# Patient Record
Sex: Male | Born: 1960 | Race: White | Hispanic: No | Marital: Single | Smoking: Former smoker
Health system: Southern US, Community
[De-identification: ages and names within clinical notes are randomized; demographics above are authoritative.]

## PROBLEM LIST (undated history)

## (undated) DIAGNOSIS — I10 Essential (primary) hypertension: Secondary | ICD-10-CM

## (undated) DIAGNOSIS — E785 Hyperlipidemia, unspecified: Secondary | ICD-10-CM

## (undated) DIAGNOSIS — I499 Cardiac arrhythmia, unspecified: Secondary | ICD-10-CM

## (undated) HISTORY — DX: Essential (primary) hypertension: I10

## (undated) HISTORY — DX: Hyperlipidemia, unspecified: E78.5

## (undated) HISTORY — DX: Cardiac arrhythmia, unspecified: I49.9

## (undated) HISTORY — PX: WISDOM TOOTH EXTRACTION: SHX21

---
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
## 2016-02-22 ENCOUNTER — Other Ambulatory Visit: Payer: Self-pay | Admitting: Internal Medicine

## 2016-02-22 DIAGNOSIS — E7800 Pure hypercholesterolemia, unspecified: Secondary | ICD-10-CM

## 2016-02-22 DIAGNOSIS — I159 Secondary hypertension, unspecified: Secondary | ICD-10-CM

## 2016-02-22 DIAGNOSIS — IMO0001 Reserved for inherently not codable concepts without codable children: Secondary | ICD-10-CM

## 2016-02-25 ENCOUNTER — Encounter: Payer: Self-pay | Admitting: Internal Medicine

## 2016-03-05 ENCOUNTER — Ambulatory Visit
Admission: RE | Admit: 2016-03-05 | Discharge: 2016-03-05 | Disposition: A | Discharge status: Home / Self Care | Payer: 59 | Source: Ambulatory Visit | Attending: Internal Medicine | Admitting: Internal Medicine

## 2016-03-05 DIAGNOSIS — E7800 Pure hypercholesterolemia, unspecified: Secondary | ICD-10-CM

## 2016-03-05 DIAGNOSIS — I159 Secondary hypertension, unspecified: Secondary | ICD-10-CM

## 2016-03-05 DIAGNOSIS — IMO0001 Reserved for inherently not codable concepts without codable children: Secondary | ICD-10-CM

## 2016-04-25 ENCOUNTER — Ambulatory Visit (AMBULATORY_SURGERY_CENTER): Payer: Self-pay

## 2016-04-25 VITALS — Ht 71.0 in | Wt 232.6 lb

## 2016-04-25 DIAGNOSIS — Z1211 Encounter for screening for malignant neoplasm of colon: Secondary | ICD-10-CM

## 2016-04-25 NOTE — Progress Notes (Signed)
No allergies to eggs or soy No past problems with anesthesia No diet meds No home oxygen  Declined emmi 

## 2016-04-28 ENCOUNTER — Encounter: Payer: Self-pay | Admitting: Internal Medicine

## 2016-05-09 ENCOUNTER — Encounter: Payer: Self-pay | Admitting: Gastroenterology

## 2016-05-09 ENCOUNTER — Encounter: Payer: Self-pay | Admitting: Internal Medicine

## 2016-05-09 ENCOUNTER — Ambulatory Visit (AMBULATORY_SURGERY_CENTER): Payer: 59 | Admitting: Internal Medicine

## 2016-05-09 VITALS — BP 115/76 | HR 66 | Temp 98.9°F | Resp 18 | Ht 71.0 in | Wt 232.0 lb

## 2016-05-09 DIAGNOSIS — Z1211 Encounter for screening for malignant neoplasm of colon: Secondary | ICD-10-CM | POA: Diagnosis not present

## 2016-05-09 MED ORDER — FLEET ENEMA 7-19 GM/118ML RE ENEM
1.0000 | ENEMA | Freq: Once | RECTAL | Status: AC
  Administered 2016-05-09: 1 via RECTAL

## 2016-05-09 MED ORDER — SODIUM CHLORIDE 0.9 % IV SOLN: 500.0000 mL | INTRAVENOUS | Status: AC

## 2016-05-09 NOTE — Patient Instructions (Addendum)
   Normal colonoscopy  Next routine colonoscopy in 10 years - 2027  I appreciate the opportunity to care for you. Iva Booparl E. Gessner, MD, FACG  YOU HAD AN ENDOSCOPIC PROCEDURE TODAY AT THE [facility]:   Refer to the procedure report that was given to you for any specific questions about what was found during the examination.  If the procedure report does not answer your questions, please call your gastroenterologist to clarify.  If you requested that your care partner not be given the details of your procedure findings, then the procedure report has been included in a sealed envelope for you to review at your convenience later.  YOU SHOULD EXPECT: Some feelings of bloating in the abdomen. Passage of more gas than usual.  Walking can help get rid of the air that was put into your GI tract during the procedure and reduce the bloating. If you had a lower endoscopy (such as a colonoscopy or flexible sigmoidoscopy) you may notice spotting of blood in your stool or on the toilet paper. If you underwent a bowel prep for your procedure, you may not have a normal bowel movement for a few days.  Please Note:  You might notice some irritation and congestion in your nose or some drainage.  This is from the oxygen used during your procedure.  There is no need for concern and it should clear up in a day or so.  SYMPTOMS TO REPORT IMMEDIATELY:   Following lower endoscopy (colonoscopy or flexible sigmoidoscopy):  Excessive amounts of blood in the stool  Significant tenderness or worsening of abdominal pains  Swelling of the abdomen that is new, acute  Fever of 100F or higher   For urgent or emergent issues, a gastroenterologist can be reached at any hour by calling [phone].   DIET:  We do recommend a small meal at first, but then you may proceed to your regular diet.  Drink plenty of fluids but you should avoid alcoholic beverages for 24 hours.  ACTIVITY:  You should plan to take  it easy for the rest of today and you should NOT DRIVE or use heavy machinery until tomorrow (because of the sedation medicines used during the test).    FOLLOW UP: Our staff will call the number listed on your records the next business day following your procedure to check on you and address any questions or concerns that you may have regarding the information given to you following your procedure. If we do not reach you, we will leave a message.  However, if you are feeling well and you are not experiencing any problems, there is no need to return our call.  We will assume that you have returned to your regular daily activities without incident.  If any biopsies were taken you will be contacted by phone or by letter within the next 1-3 weeks.  Please call us at [phone] if you have not heard about the biopsies in 3 weeks.    SIGNATURES/CONFIDENTIALITY: You and/or your care partner have signed paperwork which will be entered into your electronic medical record.  These signatures attest to the fact that that the information above on your After Visit Summary has been reviewed and is understood.  Full responsibility of the confidentiality of this discharge information lies with you and/or your care-partner.  Read all of the information given to you by your recovery room nurse.   Thank-you for choosing us for your healthcare needs today.

## 2016-05-09 NOTE — Op Note (Signed)
[facility] Patient Name: Steve KinJeffrey Holloway Procedure Date: 05/09/2016 11:41 AM MRN: [id number] Endoscopist: Iva Booparl E Gessner , MD Age: [id number] Referring MD:  Date of Birth: [birth year] Gender: Male Account #: [id number] Procedure:                Colonoscopy Indications:              Screening for colorectal malignant neoplasm Medicines:                Propofol per Anesthesia, Monitored Anesthesia Care Procedure:                Pre-Anesthesia Assessment:                           - Prior to the procedure, a History and Physical                            was performed, and patient medications and                            allergies were reviewed. The patient's tolerance of                            previous anesthesia was also reviewed. The risks                            and benefits of the procedure and the sedation                            options and risks were discussed with the patient.                            All questions were answered, and informed consent                            was obtained. Prior Anticoagulants: The patient has                            taken no previous anticoagulant or antiplatelet                            agents. ASA Grade Assessment: II - A patient with                            mild systemic disease. After reviewing the risks                            and benefits, the patient was deemed in                            satisfactory condition to undergo the procedure.                           After obtaining informed consent, the colonoscope  was passed under direct vision. Throughout the                            procedure, the patient's blood pressure, pulse, and                            oxygen saturations were monitored continuously. The                            Model CF-HQ190L [id number]) scope was introduced                            through the anus and advanced to the the cecum,           identified by appendiceal orifice and ileocecal                            valve. The quality of the bowel preparation was                            excellent. The colonoscopy was performed without                            difficulty. The patient tolerated the procedure                            well. The bowel preparation used was Miralax. The                            ileocecal valve, appendiceal orifice, and rectum                            were photographed. Scope In: 11:44:54 AM Scope Out: 12:01:24 PM Scope Withdrawal Time: 0 hours 10 minutes 39 seconds  Total Procedure Duration: 0 hours 16 minutes 30 seconds  Findings:                 The perianal and digital rectal examinations were                            normal. Pertinent negatives include normal prostate                            (size, shape, and consistency).                           The colon (entire examined portion) appeared normal.                           No additional abnormalities were found on                            retroflexion. Complications:            No immediate complications. Estimated blood loss:  None. Estimated Blood Loss:     Estimated blood loss: none. Impression:               - The entire examined colon is normal.                           - No specimens collected. Recommendation:           - Repeat colonoscopy in 10 years for screening                            purposes.                           - Patient has a contact number available for                            emergencies. The signs and symptoms of potential                            delayed complications were discussed with the                            patient. Return to normal activities tomorrow.                            Written discharge instructions were provided to the                            patient.                           - Resume previous diet.                           -  Continue present medications.                           - Patient has a contact number available for                            emergencies. The signs and symptoms of potential                            delayed complications were discussed with the                            patient. Return to normal activities tomorrow.                            Written discharge instructions were provided to the                            patient. Iva Boop, MD 05/09/2016 12:11:11 PM This report has been signed electronically.

## 2016-05-09 NOTE — Progress Notes (Signed)
No egg or soy allergy known to patient  No issues with past sedation with any surgeries  or procedures, no intubation problems  No diet pills per patient No home 02 use per patient  No blood thinners per patient  Pt states issues with constipation periodically - No A fib or A flutter   Pt states did not look at stool- first said it was mushy brown, then said it was liquid but he didn't look at results-- informed Dr Leone PayorGessner- he instructed enema x 1- pt did enema and results were yellow, slight cloudiness and some sediment but mostly clear.  MD informed- Bufford Spikesmarie Tyshon Fanning rn

## 2016-05-09 NOTE — Progress Notes (Signed)
A/ox3 pleased with MAC, report to Suzanne RN 

## 2016-05-12 ENCOUNTER — Telehealth: Payer: Self-pay

## 2016-05-12 NOTE — Telephone Encounter (Signed)
  Follow up Call-  Call back number 05/09/2016  Post procedure Call Back phone  # [phone]  Permission to leave phone message Yes  Some recent data might be hidden    Patient was called for follow up after his procedure on 05/09/2016. No answer at the number given for follow up phone call. This was the second attempt to contact the patient. A message was left on the answering machine.

## 2016-05-12 NOTE — Telephone Encounter (Signed)
Called [phone] with the pt ID on the message, asked him to call us back if he has any questions or concerns. maw

## 2016-12-09 ENCOUNTER — Ambulatory Visit: Payer: Self-pay

## 2016-12-09 ENCOUNTER — Ambulatory Visit (INDEPENDENT_AMBULATORY_CARE_PROVIDER_SITE_OTHER): Payer: 59 | Admitting: Sports Medicine

## 2016-12-09 ENCOUNTER — Encounter: Payer: Self-pay | Admitting: Sports Medicine

## 2016-12-09 VITALS — BP 118/71 | Ht 71.0 in | Wt 230.0 lb

## 2016-12-09 DIAGNOSIS — G8929 Other chronic pain: Secondary | ICD-10-CM

## 2016-12-09 DIAGNOSIS — M25562 Pain in left knee: Secondary | ICD-10-CM | POA: Diagnosis not present

## 2016-12-09 NOTE — Progress Notes (Signed)
  Steve Holloway - 56 y.o. male MRN [id number]  Date of birth: 1961-06-13  SUBJECTIVE:  Including CC & ROS.   Mr. Steve Holloway is a 40 her old male is presenting with left knee pain. He reports about a year ago that he was performing leg extensions and then running on a treadmill and felt a sharp pain upon his knee. There is no swelling or bruising when this initially occurred. Since that time he has had intermittent medial knee pain with activity. He feels like the pain has stayed the same. He does have some soreness and stiffness after being active. He is invited take any medications for this pain.  ROS: No unexpected weight loss, fever, chills, swelling, instability, muscle pain, numbness/tingling, redness, otherwise see HPI   HISTORY: Past Medical, Surgical, Social, and Family History Reviewed & Updated per EMR.   Pertinent Historical Findings include: PMHx - hyperlipidemia, hypertension PSx-  I&D of scrotal cyst PSHx -  occasional tobacco and alcohol use FHx -  diabetes and hypertension  DATA REVIEWED: none  PHYSICAL EXAM:  VS: BP 118/71   Ht  (1.803 m)   Wt 230 lb (104.3 kg)   BMI 32.08 kg/m  PHYSICAL EXAM: Gen: NAD, alert, cooperative with exam, well-appearing HEENT: clear conjunctiva, EOMI CV:  no edema, capillary refill brisk,  Resp: non-labored, normal speech Skin: no rashes, normal turgor  Neuro: no gross deficits.  Psych:  alert and oriented MSK: Left knee: No obvious effusion. No significant bony tenderness to the lateral or medial femoral condyle. Tenderness to palpation of the medial joint line. No tenderness to palpation over the lateral joint line. No tenderness to palpation the clot or patellar tendon. Normal flexion and extension. Normal strength. Normal and points valgus and varus stress testing. No pain with patellar compression and grind. Positive Thessaly test. Neurovascularly intact.  Limited ultrasound: Left Knee:  There is a mild effusion in  the suprapatellar pouch. The quadriceps tendon and patellar tendon were normal appearance. The lateral joint line displayed some arthritic changes with spurring. The medial joint line showed some spurring and flattening of the tibial plateau as well as some outpouching of the medial meniscus and some effusion in this area. The trochlear groove and some spurring of the medial and lateral femoral condyle  Summary: Findings are consistent with a arthritic changes in the medial joint line as well as outpouching of the medial meniscus to suggest degenerative meniscal change.    Ultrasound and interpretation by Clare Gandy, MD    ASSESSMENT & PLAN:   Chronic pain of left knee Likely his pain is associated with an underlying arthritic change but also has some degenerative changes in his meniscus as well. He would like to be as conservative as he can with treating this as his pain is not severe - Provided home exercises - Encouraged she can wear compression or take over-the-counter anti-inflammatories as needed - He'll follow-up in 6 weeks and if not improved can consider injection therapy and x-rays of his knee at that time.

## 2016-12-09 NOTE — Assessment & Plan Note (Signed)
Likely his pain is associated with an underlying arthritic change but also has some degenerative changes in his meniscus as well. He would like to be as conservative as he can with treating this as his pain is not severe - Provided home exercises - Encouraged she can wear compression or take over-the-counter anti-inflammatories as needed - He'll follow-up in 6 weeks and if not improved can consider injection therapy and x-rays of his knee at that time.

## 2016-12-30 ENCOUNTER — Other Ambulatory Visit: Payer: Self-pay | Admitting: *Deleted

## 2016-12-30 MED ORDER — MELOXICAM 15 MG PO TABS: ORAL_TABLET | ORAL | 1 refills | Status: AC

## 2017-01-19 ENCOUNTER — Encounter: Payer: Self-pay | Admitting: Sports Medicine

## 2017-01-19 ENCOUNTER — Ambulatory Visit (INDEPENDENT_AMBULATORY_CARE_PROVIDER_SITE_OTHER): Payer: 59 | Admitting: Sports Medicine

## 2017-01-19 VITALS — BP 142/80 | Ht 71.0 in | Wt 230.0 lb

## 2017-01-19 DIAGNOSIS — M25562 Pain in left knee: Secondary | ICD-10-CM

## 2017-01-19 NOTE — Progress Notes (Signed)
   Subjective:    Patient ID: Steve Holloway, male    DOB: May 20, 1961, 56 y.o.   MRN: [id number]  HPI Steve Holloway is a 56yo male presenting for knee pain follow up. Reports significant improvement in knee pain since last office visit (12/09/16) and denies pain today. Stopped taking Mobic 1.5wk ago since he believed it was causing constipation. Has noted no worsening of pain since discontinuing. Has not been doing the exercises given, but has instead been doing leg exercises at the gym 3-4 times per week, including knee extensions and flexion with weight. Denies knee locking or giving out.   Review of Systems Per HPI    Objective:   Physical Exam  Constitutional: He appears well-developed and well-nourished. No distress.  Cardiovascular: Normal rate.   Pulmonary/Chest: Effort normal. No respiratory distress.  Musculoskeletal:  ROM of knees symmetric bilaterally. No effusion noted in bilateral knees. Mild joint line tenderness of medial knee, improved since last exam. Mildly positive Thessaly's, improved from last visit. ACL, PCL, LCL, and MCL intact and symmetric bilaterally. No patellar apprehension noted.      Assessment & Plan:  1. Acute pain of left knee Improved. 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 Recommend knee exercises on days he does not go to the gym. May use Naproxen or Ibuprofen as needed for acute flares--recommend against scheduled dosing. Follow up as needed. Consider xrays and/or injection if worsening occurs.

## 2017-08-17 DIAGNOSIS — Z23 Encounter for immunization: Secondary | ICD-10-CM | POA: Diagnosis not present

## 2018-02-17 DIAGNOSIS — E7800 Pure hypercholesterolemia, unspecified: Secondary | ICD-10-CM | POA: Diagnosis not present

## 2018-02-17 DIAGNOSIS — Z125 Encounter for screening for malignant neoplasm of prostate: Secondary | ICD-10-CM | POA: Diagnosis not present

## 2018-02-17 DIAGNOSIS — Z0000 Encounter for general adult medical examination without abnormal findings: Secondary | ICD-10-CM | POA: Diagnosis not present

## 2018-02-24 DIAGNOSIS — Z0000 Encounter for general adult medical examination without abnormal findings: Secondary | ICD-10-CM | POA: Diagnosis not present

## 2018-02-24 DIAGNOSIS — M542 Cervicalgia: Secondary | ICD-10-CM | POA: Diagnosis not present

## 2018-02-24 DIAGNOSIS — D649 Anemia, unspecified: Secondary | ICD-10-CM | POA: Diagnosis not present

## 2018-02-24 DIAGNOSIS — Z833 Family history of diabetes mellitus: Secondary | ICD-10-CM | POA: Diagnosis not present

## 2018-06-16 DIAGNOSIS — Z23 Encounter for immunization: Secondary | ICD-10-CM | POA: Diagnosis not present

## 2021-01-29 ENCOUNTER — Other Ambulatory Visit: Payer: Self-pay | Admitting: Internal Medicine

## 2021-01-29 DIAGNOSIS — E7800 Pure hypercholesterolemia, unspecified: Secondary | ICD-10-CM

## 2021-01-29 DIAGNOSIS — I77811 Abdominal aortic ectasia: Secondary | ICD-10-CM

## 2021-02-25 ENCOUNTER — Ambulatory Visit
Admission: RE | Admit: 2021-02-25 | Discharge: 2021-02-25 | Disposition: A | Discharge status: Home / Self Care | Payer: 59 | Source: Ambulatory Visit | Attending: Internal Medicine | Admitting: Internal Medicine

## 2021-02-25 ENCOUNTER — Ambulatory Visit
Admission: RE | Admit: 2021-02-25 | Discharge: 2021-02-25 | Disposition: A | Discharge status: Home / Self Care | Payer: No Typology Code available for payment source | Source: Ambulatory Visit | Attending: Internal Medicine | Admitting: Internal Medicine

## 2021-02-25 DIAGNOSIS — I77811 Abdominal aortic ectasia: Secondary | ICD-10-CM

## 2021-02-25 DIAGNOSIS — E7800 Pure hypercholesterolemia, unspecified: Secondary | ICD-10-CM

## 2021-12-05 IMAGING — CT CT CARDIAC CORONARY ARTERY CALCIUM SCORE
3 series · 12 of 20 positions shown, 14 images · non-contrast
Comparison: None.

CLINICAL DATA: 59-year-old Caucasian male with history
hyperlipidemia, hypertension and family history of heart disease.

EXAM:
CT CARDIAC CORONARY ARTERY CALCIUM SCORE
TECHNIQUE: Non-contrast imaging through the heart was performed using
prospective ECG gating. Image post processing was performed on an
independent workstation, allowing for quantitative analysis of the
heart and coronary arteries. Note that this exam targets the heart
and the chest was not imaged in its entirety.

[Series 2: calcium scoring 2.00 qr36 bestdiast 70% hrt calciu · axial · 0.45mm/px · z∈[+1646,+1674]mm · 2 of 70 slices shown]
[im 14/70  vessel]
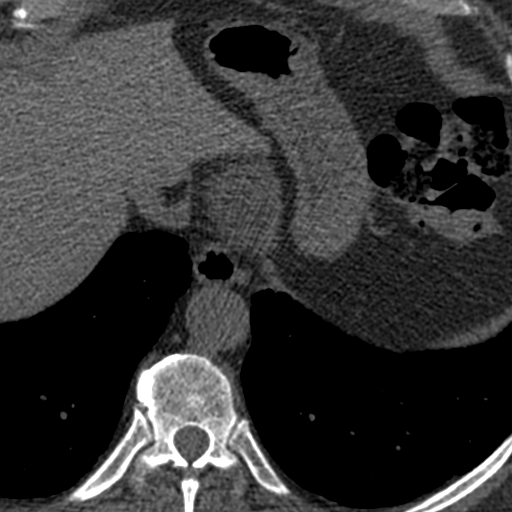
[im 28/70  vessel]
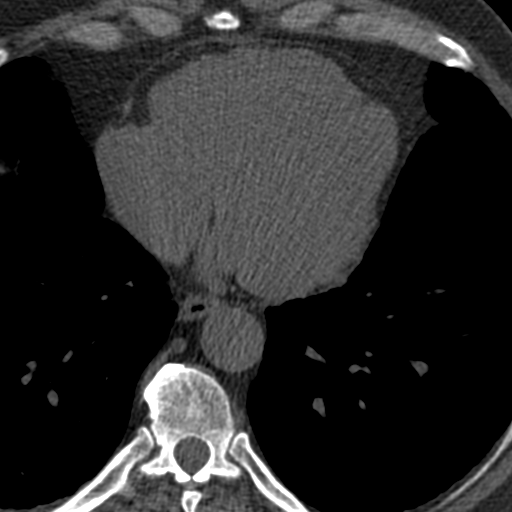

[Series 3: calcium scoring 2.00 br40 bestdiast 70% axial · axial · 0.56mm/px · z∈[+1642,+1734]mm · 5 of 70 slices shown, 7 images]
[im 12/70  vessel]
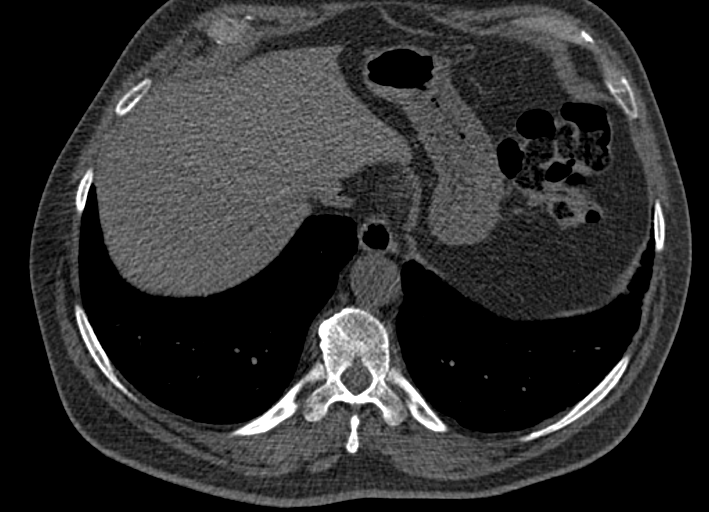
[im 12/70  lung]
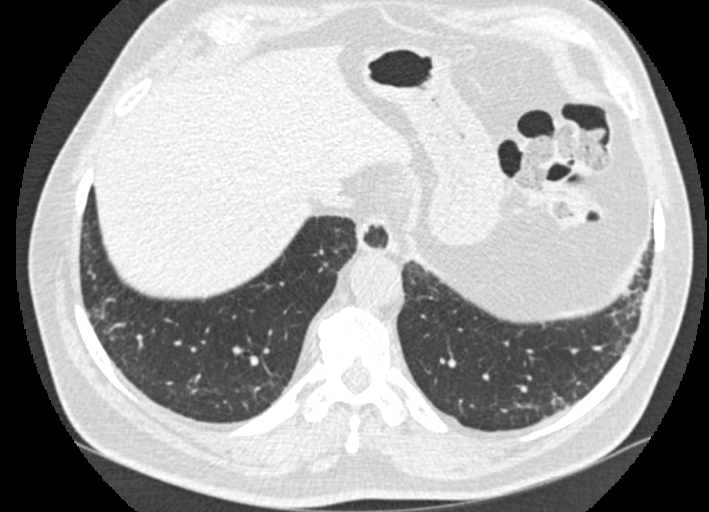
[im 24/70  vessel]
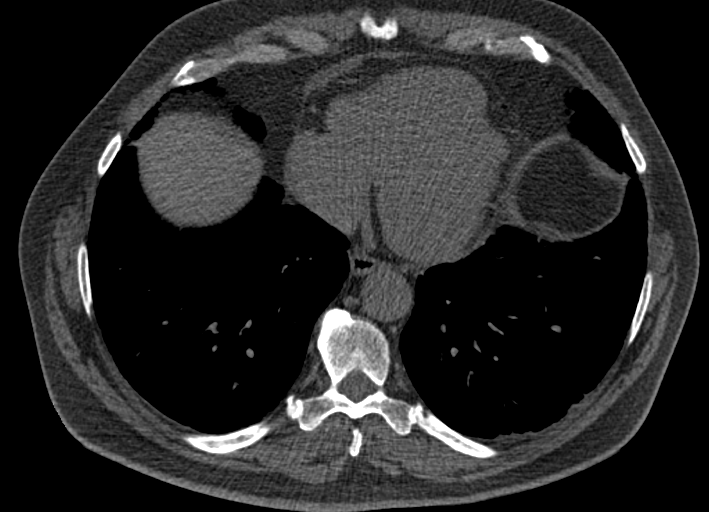
[im 35/70  vessel]
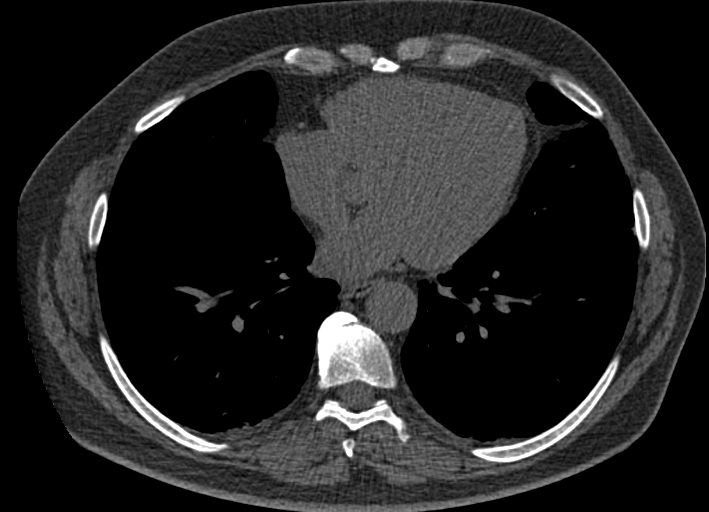
[im 47/70  vessel]
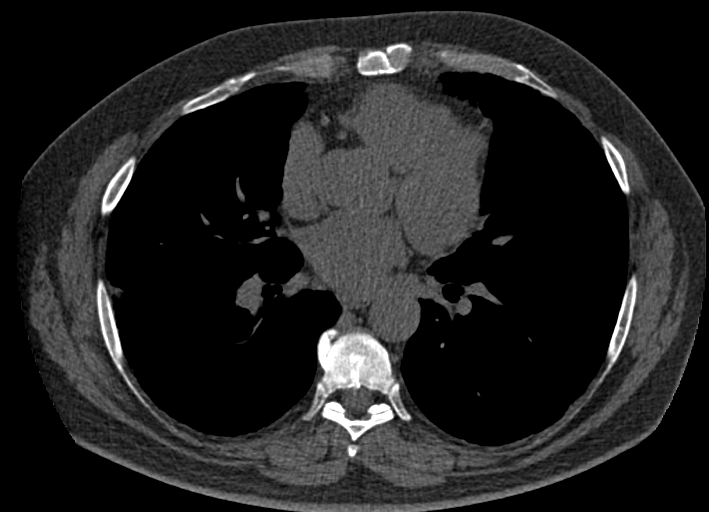
[im 58/70  vessel]
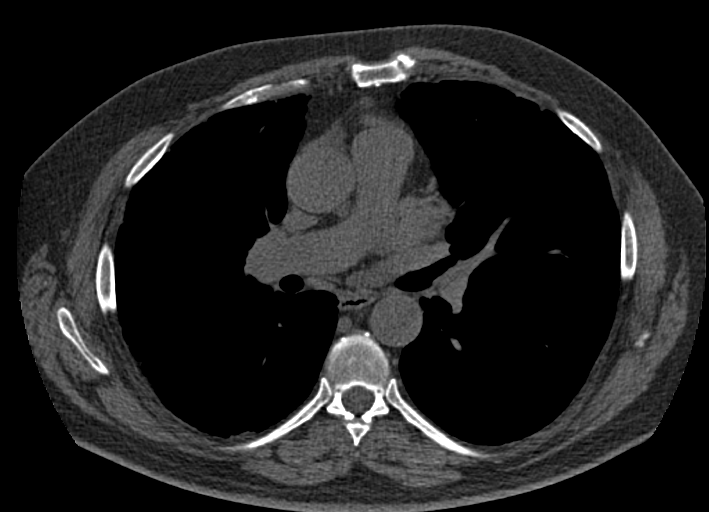
[im 58/70  lung]
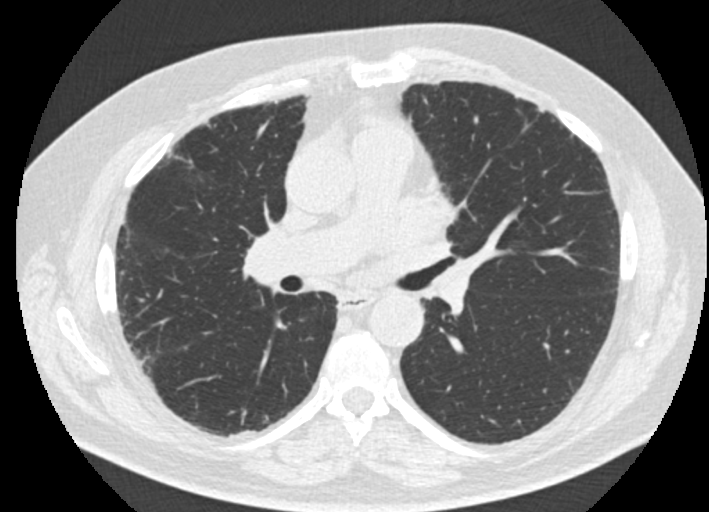

[Series 9: calcium scoring 2.00 br60 bestdiast 70% lungs · axial · 0.56mm/px · z∈[+1642,+1734]mm · 5 of 70 slices shown]
[im 12/70  vessel]
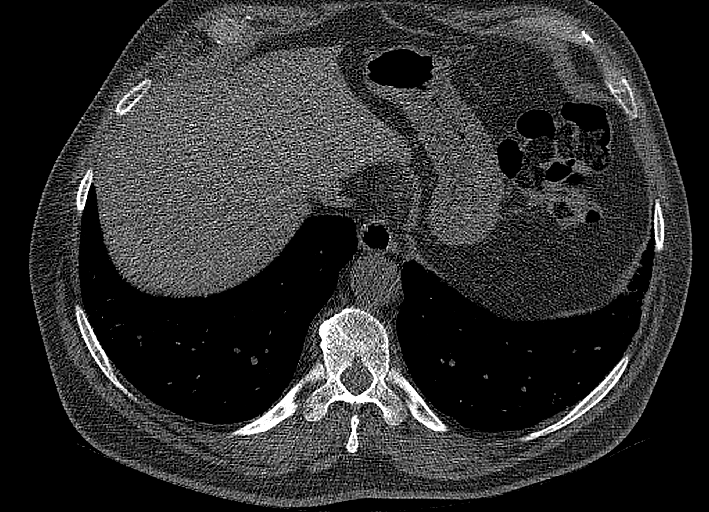
[im 24/70  vessel]
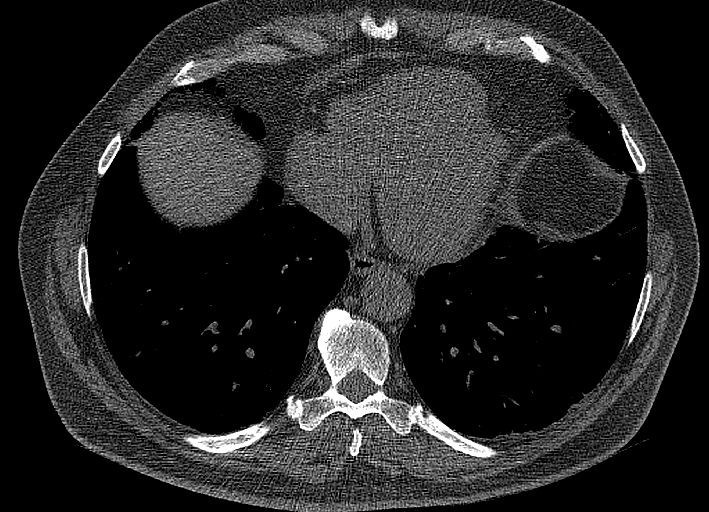
[im 35/70  vessel]
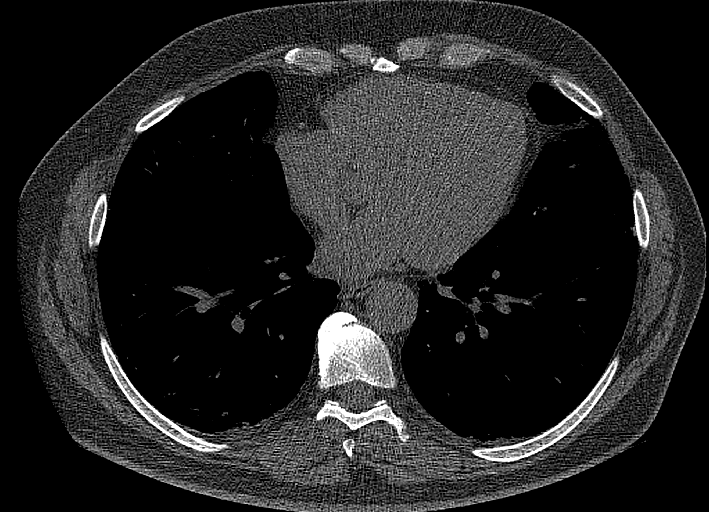
[im 47/70  vessel]
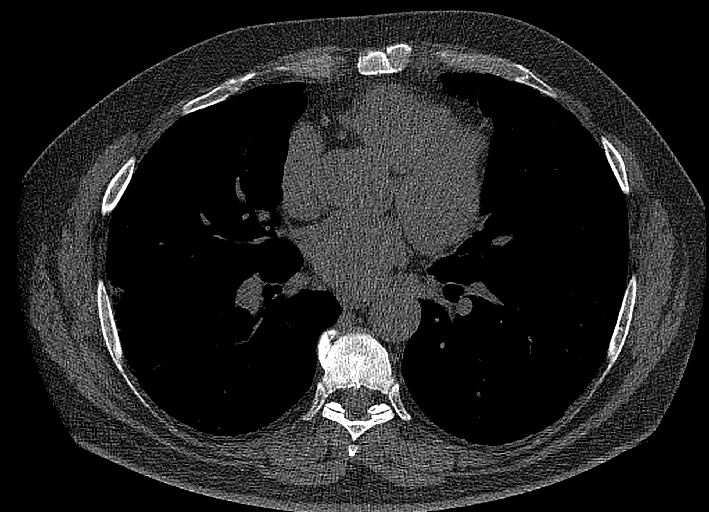
[im 58/70  vessel]
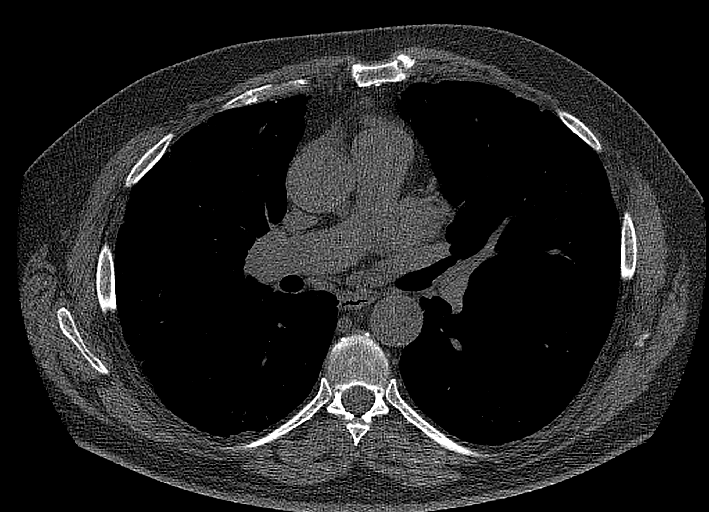

[12 of 20 positions shown; findings below may reference images not displayed]

FINDINGS: CORONARY CALCIUM SCORES:

Left Main: 0

LAD:

LCx: 0

RCA:

Total Agatston Score:

[HOSPITAL] percentile: 46

AORTA MEASUREMENTS:

Ascending Aorta: 37 mm

Descending Aorta: 20 mm

OTHER FINDINGS:

The heart size is within normal limits. No pericardial fluid
identified. Visualized segments of the thoracic aorta and central
pulmonary arteries are normal in caliber. Visualized mediastinum and
hilar regions demonstrate no lymphadenopathy or masses. Visualized
lungs demonstrate subpleural interstitial thickening, scarring and
nodularity likely reflective of underlying chronic lung disease.
More focal area of nodularity along the lateral subpleural aspect of
the left major fissure on images 37-39 is at the edge fissural
thickening and is suggestive of postinflammatory nodularity. This
measures up to approximately 7 mm. Visualized lungs show no evidence
of pulmonary edema, consolidation, pneumothorax or pleural fluid.
Visualized upper abdomen and bony structures are unremarkable.
IMPRESSION: 1. Coronary calcium score of 21.8 is at the 46th percentile for the
patient's age, sex and race.
2. Evidence of peripheral subpleural interstitial thickening and
scarring consistent with chronic lung disease. More focal area of
nodularity adjacent to scarring/fissural thickening along the
lateral subpleural aspect of the left major fissure measures up to 7
mm. Non-contrast chest CT at 6-12 months is recommended. If the
nodule is stable at time of repeat CT, then future CT at 18-24
months (from today's scan) is considered optional for low-risk
patients, but is recommended for high-risk patients. This
recommendation follows the consensus statement: Guidelines for
Management of Incidental Pulmonary Nodules Detected on CT Images:

## 2022-01-29 ENCOUNTER — Other Ambulatory Visit: Payer: Self-pay | Admitting: Internal Medicine

## 2022-01-29 DIAGNOSIS — R918 Other nonspecific abnormal finding of lung field: Secondary | ICD-10-CM

## 2022-02-25 ENCOUNTER — Ambulatory Visit
Admission: RE | Admit: 2022-02-25 | Discharge: 2022-02-25 | Disposition: A | Discharge status: Home / Self Care | Payer: 59 | Source: Ambulatory Visit | Attending: Internal Medicine | Admitting: Internal Medicine

## 2022-02-25 DIAGNOSIS — R918 Other nonspecific abnormal finding of lung field: Secondary | ICD-10-CM

## 2023-02-04 ENCOUNTER — Other Ambulatory Visit: Payer: Self-pay | Admitting: Internal Medicine

## 2023-02-04 DIAGNOSIS — J8410 Pulmonary fibrosis, unspecified: Secondary | ICD-10-CM

## 2023-02-13 ENCOUNTER — Encounter: Payer: Self-pay | Admitting: Internal Medicine

## 2023-02-27 ENCOUNTER — Ambulatory Visit
Admission: RE | Admit: 2023-02-27 | Discharge: 2023-02-27 | Disposition: A | Discharge status: Home / Self Care | Payer: Medicaid Other | Source: Ambulatory Visit | Attending: Internal Medicine | Admitting: Internal Medicine

## 2023-02-27 DIAGNOSIS — J8410 Pulmonary fibrosis, unspecified: Secondary | ICD-10-CM

## 2024-05-09 ENCOUNTER — Other Ambulatory Visit: Payer: Self-pay | Admitting: Internal Medicine

## 2024-05-09 DIAGNOSIS — R9389 Abnormal findings on diagnostic imaging of other specified body structures: Secondary | ICD-10-CM
